# Patient Record
Sex: Female | Born: 1965 | Race: White | Hispanic: No | Marital: Married | Smoking: Never smoker
Health system: Southern US, Community
[De-identification: ages and names within clinical notes are randomized; demographics above are authoritative.]

## PROBLEM LIST (undated history)

## (undated) DIAGNOSIS — F329 Major depressive disorder, single episode, unspecified: Secondary | ICD-10-CM

## (undated) DIAGNOSIS — K219 Gastro-esophageal reflux disease without esophagitis: Secondary | ICD-10-CM

## (undated) DIAGNOSIS — Z8489 Family history of other specified conditions: Secondary | ICD-10-CM

## (undated) DIAGNOSIS — T148XXA Other injury of unspecified body region, initial encounter: Secondary | ICD-10-CM

## (undated) DIAGNOSIS — M1990 Unspecified osteoarthritis, unspecified site: Secondary | ICD-10-CM

## (undated) DIAGNOSIS — G2581 Restless legs syndrome: Secondary | ICD-10-CM

## (undated) DIAGNOSIS — F419 Anxiety disorder, unspecified: Secondary | ICD-10-CM

## (undated) DIAGNOSIS — F32A Depression, unspecified: Secondary | ICD-10-CM

## (undated) DIAGNOSIS — E119 Type 2 diabetes mellitus without complications: Secondary | ICD-10-CM

## (undated) DIAGNOSIS — I10 Essential (primary) hypertension: Secondary | ICD-10-CM

## (undated) HISTORY — PX: TONSILLECTOMY: SUR1361

## (undated) HISTORY — PX: HERNIA REPAIR: SHX51

## (undated) HISTORY — PX: JOINT REPLACEMENT: SHX530

---

## 2006-01-17 ENCOUNTER — Encounter: Payer: Self-pay | Admitting: Specialist

## 2006-01-30 ENCOUNTER — Encounter: Payer: Self-pay | Admitting: Specialist

## 2006-11-13 ENCOUNTER — Ambulatory Visit: Payer: Self-pay

## 2007-07-27 ENCOUNTER — Emergency Department: Payer: Self-pay | Admitting: Emergency Medicine

## 2007-07-28 ENCOUNTER — Ambulatory Visit: Payer: Self-pay | Admitting: Emergency Medicine

## 2007-12-14 IMAGING — CT CT STONE STUDY
1 of 2 series · 15 of 32 positions shown, 19 images · non-contrast
Comparison: none

REASON FOR EXAM: abdominal pain
COMMENTS:

[Series 2: stone · axial · 0.75mm/px · z∈[-595,-154]mm · 15 of 161 slices shown, 19 images]
[im 7/161  soft-tissue]
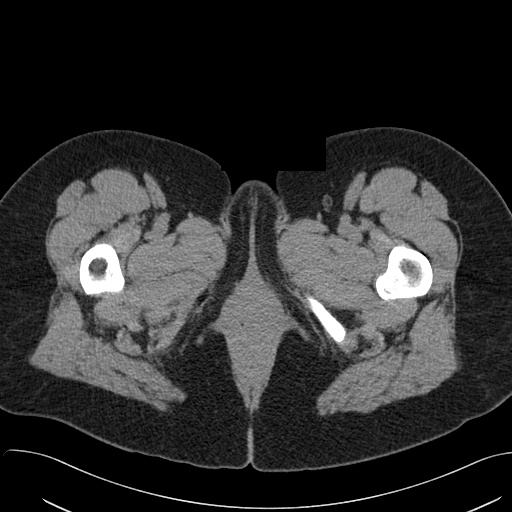
[im 7/161  bone]
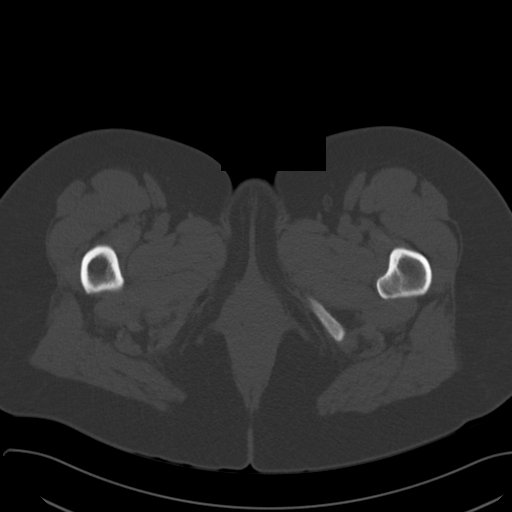
[im 21/161  soft-tissue]
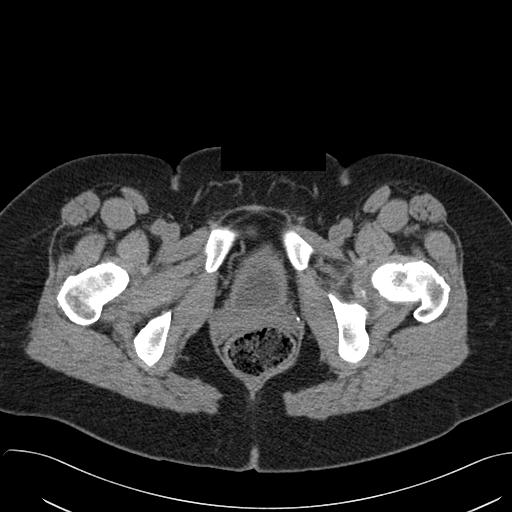
[im 34/161  soft-tissue]
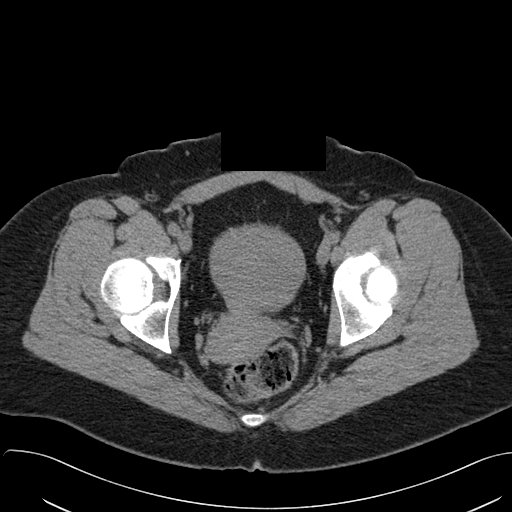
[im 47/161  soft-tissue]
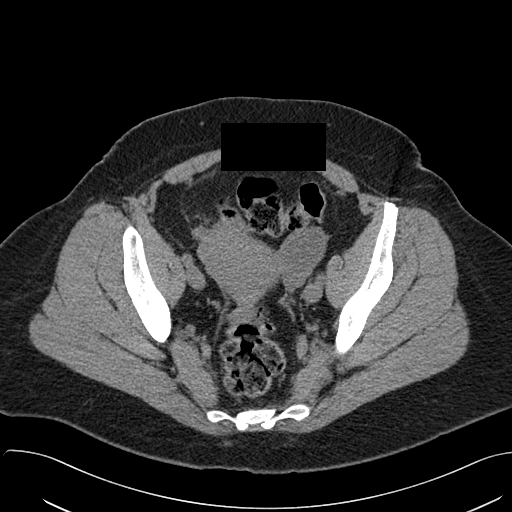
[im 54/161  soft-tissue]
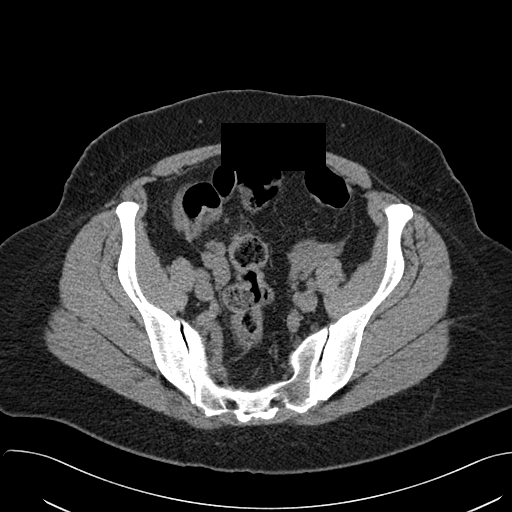
[im 67/161  soft-tissue]
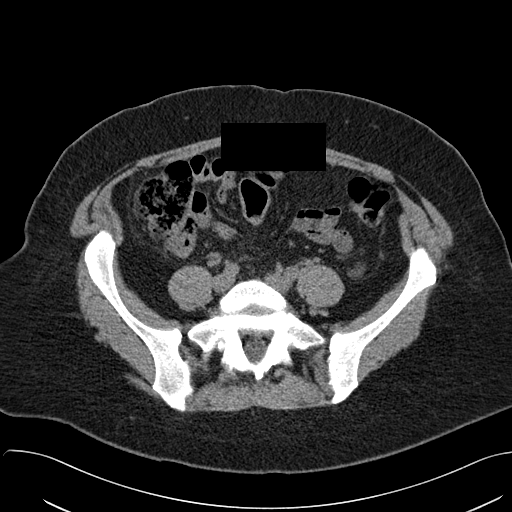
[im 81/161  soft-tissue]
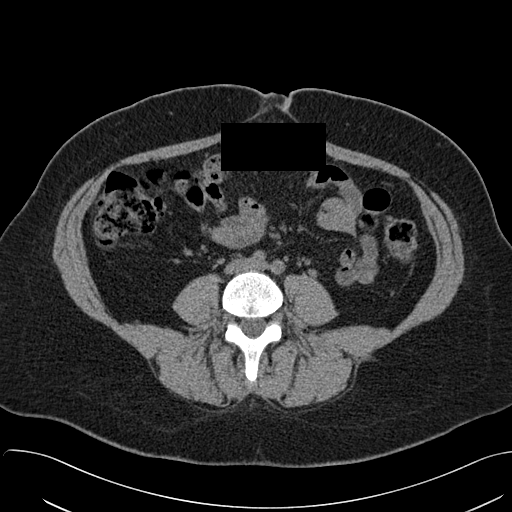
[im 94/161  soft-tissue]
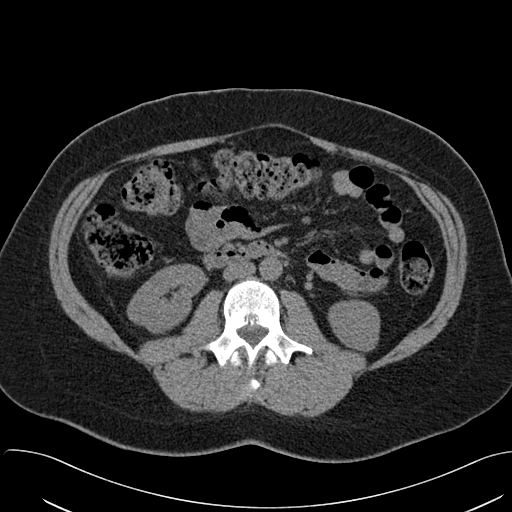
[im 107/161  soft-tissue]
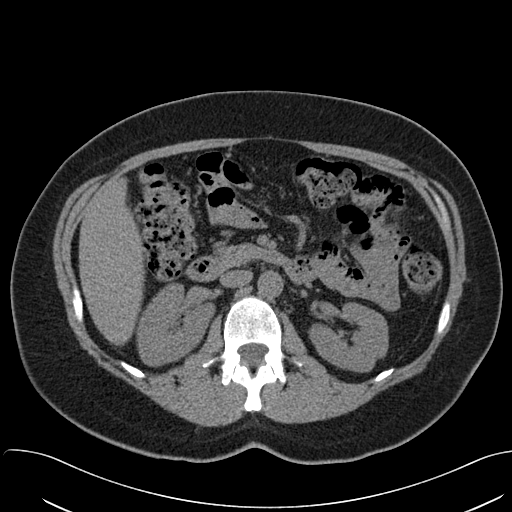
[im 107/161  bone]
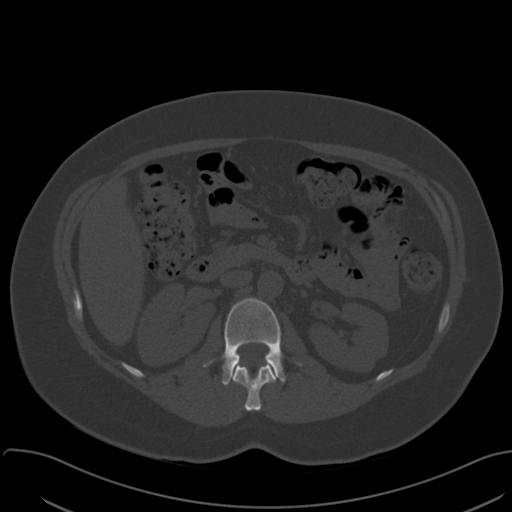
[im 114/161  soft-tissue]
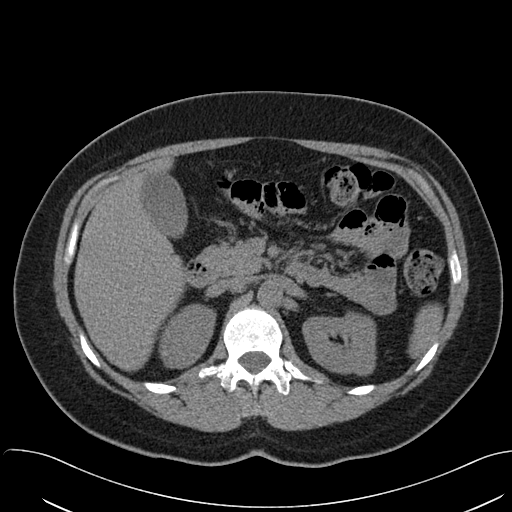
[im 127/161  soft-tissue]
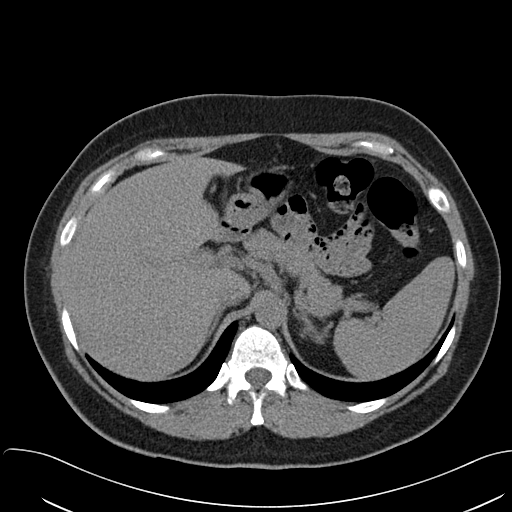
[im 134/161  lung]
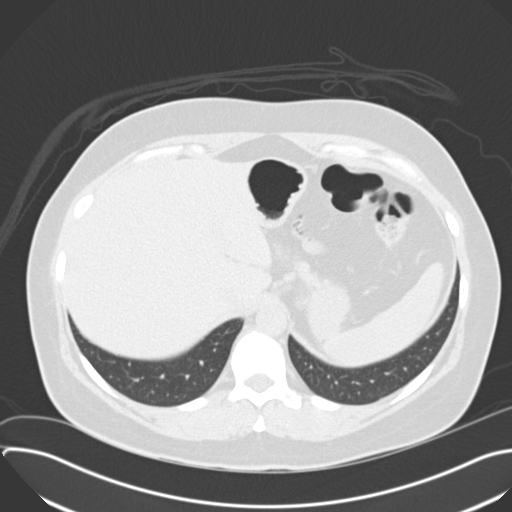
[im 141/161  soft-tissue]
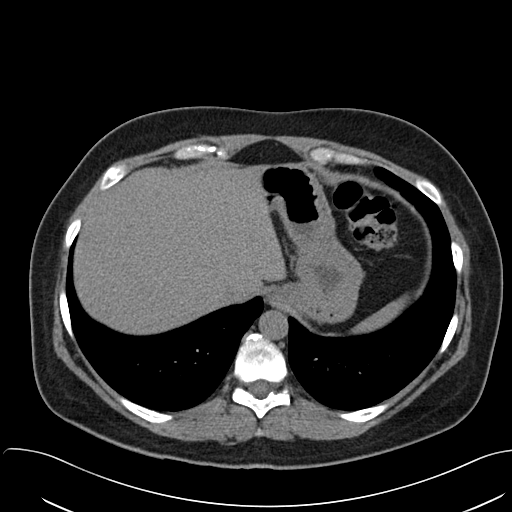
[im 141/161  lung]
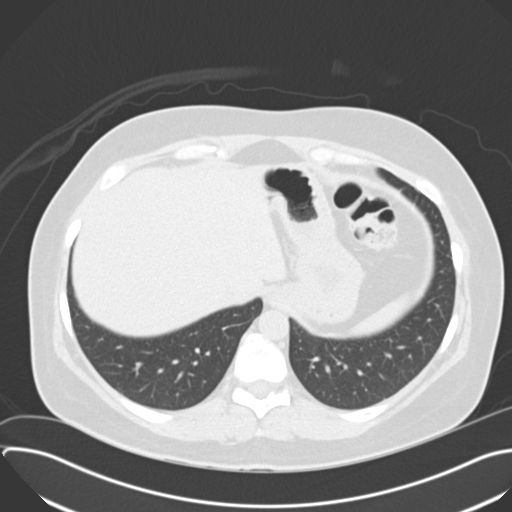
[im 147/161  lung]
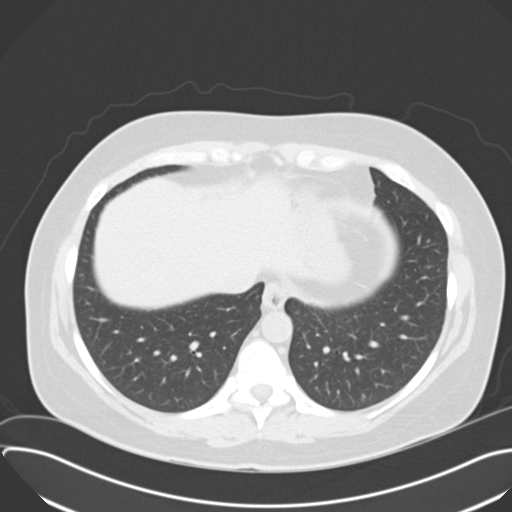
[im 154/161  soft-tissue]
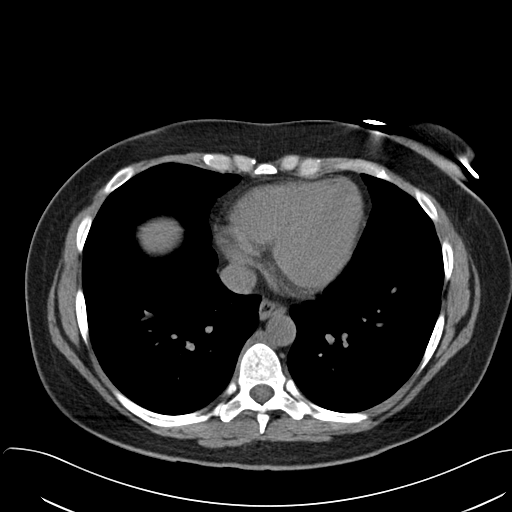
[im 154/161  lung]
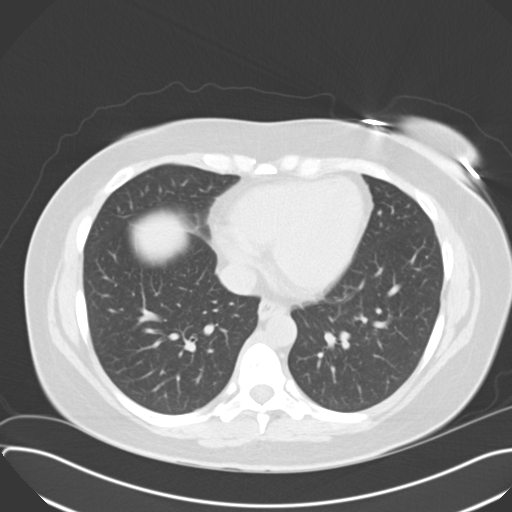

[15 of 32 positions shown; findings below may reference images not displayed]

PROCEDURE:     CT  - CT ABDOMEN /PELVIS WO (STONE)  - July 27, 2007  [DATE]

RESULT:     Helical non-contrast 3 mm sections were obtained from the lung
bases through the pubic symphysis.

Evaluation of the lung bases demonstrates no gross abnormalities.

Within the limitations of a non-contrast CT, the liver, spleen, adrenals and
pancreas are unremarkable. Evaluation of the RIGHT and LEFT kidneys
demonstrates no evidence of hydronephrosis, masses or calculi. There is no
evidence of hydroureter or ureterolithiasis. Phleboliths are identified
within the pelvis. The urinary bladder is partially contracted. A low
attenuating area is appreciated along the anterior portion of the LEFT ovary
possibly representing an ovarian cyst. This area roughly measures 3.3 x
cm in AP by transverse dimensions. There is no CT evidence reflecting the
sequela of appendicitis, diverticulitis, bowel obstruction or enteritis. A
moderate to large amount of stool is appreciated within the colon.
IMPRESSION: 1)No CT evidence of renal calculus disease.

2)Findings which may represent the sequela of a LEFT ovarian cyst.  Further
evaluation with pelvic ultrasound can be obtained if and as clinically
warranted.

Dr. Takisha of the Emergency Room was informed of these findings at the time
of the initial interpretation.

## 2008-01-12 ENCOUNTER — Ambulatory Visit: Payer: Self-pay

## 2009-01-31 ENCOUNTER — Ambulatory Visit: Payer: Self-pay

## 2009-12-15 ENCOUNTER — Ambulatory Visit: Payer: Self-pay | Admitting: General Surgery

## 2009-12-25 ENCOUNTER — Ambulatory Visit: Payer: Self-pay | Admitting: General Surgery

## 2010-05-17 ENCOUNTER — Ambulatory Visit: Payer: Self-pay | Admitting: Family Medicine

## 2010-05-30 ENCOUNTER — Ambulatory Visit: Payer: Self-pay | Admitting: General Practice

## 2010-12-22 ENCOUNTER — Emergency Department: Payer: Self-pay | Admitting: Emergency Medicine

## 2011-01-23 ENCOUNTER — Encounter: Payer: Self-pay | Admitting: Specialist

## 2011-01-31 ENCOUNTER — Encounter: Payer: Self-pay | Admitting: Specialist

## 2011-03-03 ENCOUNTER — Encounter: Payer: Self-pay | Admitting: Specialist

## 2017-09-01 ENCOUNTER — Encounter
Admission: RE | Admit: 2017-09-01 | Discharge: 2017-09-01 | Disposition: A | Discharge status: Home / Self Care | Payer: BLUE CROSS/BLUE SHIELD | Source: Ambulatory Visit | Attending: Surgery | Admitting: Surgery

## 2017-09-01 DIAGNOSIS — R9431 Abnormal electrocardiogram [ECG] [EKG]: Secondary | ICD-10-CM | POA: Diagnosis not present

## 2017-09-01 DIAGNOSIS — D171 Benign lipomatous neoplasm of skin and subcutaneous tissue of trunk: Secondary | ICD-10-CM | POA: Diagnosis not present

## 2017-09-01 DIAGNOSIS — Z01818 Encounter for other preprocedural examination: Secondary | ICD-10-CM | POA: Diagnosis not present

## 2017-09-01 HISTORY — DX: Family history of other specified conditions: Z84.89

## 2017-09-01 HISTORY — DX: Other injury of unspecified body region, initial encounter: T14.8XXA

## 2017-09-01 HISTORY — DX: Unspecified osteoarthritis, unspecified site: M19.90

## 2017-09-01 HISTORY — DX: Restless legs syndrome: G25.81

## 2017-09-01 HISTORY — DX: Gastro-esophageal reflux disease without esophagitis: K21.9

## 2017-09-01 HISTORY — DX: Anxiety disorder, unspecified: F41.9

## 2017-09-01 HISTORY — DX: Essential (primary) hypertension: I10

## 2017-09-01 HISTORY — DX: Major depressive disorder, single episode, unspecified: F32.9

## 2017-09-01 HISTORY — DX: Depression, unspecified: F32.A

## 2017-09-01 HISTORY — DX: Type 2 diabetes mellitus without complications: E11.9

## 2017-09-01 LAB — DIFFERENTIAL
BASOS PCT: 1 %
Basophils Absolute: 0 10*3/uL (ref 0–0.1)
EOS ABS: 0.1 10*3/uL (ref 0–0.7)
Eosinophils Relative: 2 %
LYMPHS ABS: 2.1 10*3/uL (ref 1.0–3.6)
Lymphocytes Relative: 34 %
MONO ABS: 0.6 10*3/uL (ref 0.2–0.9)
MONOS PCT: 10 %
Neutro Abs: 3.3 10*3/uL (ref 1.4–6.5)
Neutrophils Relative %: 53 %

## 2017-09-01 LAB — CBC
HEMATOCRIT: 38.9 % (ref 35.0–47.0)
HEMOGLOBIN: 13.9 g/dL (ref 12.0–16.0)
MCH: 32.1 pg (ref 26.0–34.0)
MCHC: 35.6 g/dL (ref 32.0–36.0)
MCV: 90 fL (ref 80.0–100.0)
Platelets: 271 10*3/uL (ref 150–440)
RBC: 4.32 MIL/uL (ref 3.80–5.20)
RDW: 13.2 % (ref 11.5–14.5)
WBC: 6.1 10*3/uL (ref 3.6–11.0)

## 2017-09-01 LAB — BASIC METABOLIC PANEL
ANION GAP: 9 (ref 5–15)
BUN: 21 mg/dL — AB (ref 6–20)
CO2: 25 mmol/L (ref 22–32)
Calcium: 9.3 mg/dL (ref 8.9–10.3)
Chloride: 107 mmol/L (ref 101–111)
Creatinine, Ser: 0.97 mg/dL (ref 0.44–1.00)
GFR calc Af Amer: >60 mL/min (ref >60)
GLUCOSE: 109 mg/dL — AB (ref 65–99)
POTASSIUM: 3.5 mmol/L (ref 3.5–5.1)
Sodium: 141 mmol/L (ref 135–145)

## 2017-09-01 NOTE — Pre-Procedure Instructions (Signed)
EKG ADDRESSED BY DR J ADAMS AND FAXED TO DR Nicholes Stairs TO HAVE SET UP WITH PCP OR CARDIOLOGIST. SPOKE WITH Ra Pfiester.

## 2017-09-01 NOTE — Patient Instructions (Signed)
Your procedure is scheduled on: Tuesday 09/09/17 Report to [facility]. 2ND FLOOR MEDICAL MALL ENTRANCE. To find out your arrival time please call [phone] between 1PM - 3PM on Monday 09/08/17.  Remember: Instructions that are not followed completely may result in serious medical risk, up to and including death, or upon the discretion of your surgeon and anesthesiologist your surgery may need to be rescheduled.    __X__ 1. Do not eat anything after midnight the night before your    procedure.  No gum chewing or hard candies.  You may drink clear   liquids up to 2 hours before you are scheduled to arrive at the   hospital for your procedure. Do not drink clear liquids within 2   hours of scheduled arrival to the hospital as this may lead to your   procedure being delayed or rescheduled.       Clear liquids include:   Water or Apple juice without pulp   Clear carbohydrate beverage such as Clearfast or Gatorade   Black coffee or Clear Tea (no milk, no creamer, do not add anything   to the coffee or tea)    Diabetics should only drink water   __X__ 2. No Alcohol for 24 hours before or after surgery.   ____ 3. Bring all medications with you on the day of surgery if instructed.    __X__ 4. Notify your doctor if there is any change in your medical condition     (cold, fever, infections).             __X___5. No smoking within 24 hours of your surgery.     Do not wear jewelry, make-up, hairpins, clips or nail polish.  Do not wear lotions, powders, or perfumes.   Do not shave 48 hours prior to surgery. Men may shave face and neck.  Do not bring valuables to the hospital.    [facility] is not responsible for any belongings or valuables.               Contacts, dentures or bridgework may not be worn into surgery.  Leave your suitcase in the car. After surgery it may be brought to your room.  For patients admitted to the hospital, discharge time is determined by your                treatment  team.   Patients discharged the day of surgery will not be allowed to drive home.   Please read over the following fact sheets that you were given:   MRSA Information   _X__ Take these medicines the morning of surgery with A SIP OF WATER:    1. none  2.   3.   4.  5.  6.  ____ Fleet Enema (as directed)   __X__ Use CHG Soap/SAGE wipes as directed  ____ Use inhalers on the day of surgery  ____ Stop metformin 2 days prior to surgery    ____ Take 1/2 of usual insulin dose the night before surgery and none on the morning of surgery.   __X__ Stop Coumadin/Plavix/aspirin on 09/02/17  __X__ Stop Anti-inflammatories such as Advil, Aleve, Ibuprofen, Motrin, Naproxen, Naprosyn, Goodies,powder, or aspirin products.  OK to take Tylenol.  09/06/17   __X__ Stop supplements, Vitamin E, Fish Oil until after surgery.    ____ Bring C-Pap to the hospital.

## 2017-09-09 ENCOUNTER — Ambulatory Visit
Admission: RE | Admit: 2017-09-09 | Discharge: 2017-09-09 | Disposition: A | Discharge status: Home / Self Care | Payer: BLUE CROSS/BLUE SHIELD | Source: Ambulatory Visit | Attending: Surgery | Admitting: Surgery

## 2017-09-09 ENCOUNTER — Encounter: Payer: Self-pay | Admitting: Emergency Medicine

## 2017-09-09 ENCOUNTER — Ambulatory Visit: Payer: BLUE CROSS/BLUE SHIELD | Admitting: Anesthesiology

## 2017-09-09 ENCOUNTER — Encounter
Admission: RE | Disposition: A | Discharge status: Home / Self Care | Payer: Self-pay | Source: Ambulatory Visit | Attending: Surgery

## 2017-09-09 DIAGNOSIS — Z79899 Other long term (current) drug therapy: Secondary | ICD-10-CM | POA: Diagnosis not present

## 2017-09-09 DIAGNOSIS — Z791 Long term (current) use of non-steroidal anti-inflammatories (NSAID): Secondary | ICD-10-CM | POA: Insufficient documentation

## 2017-09-09 DIAGNOSIS — I10 Essential (primary) hypertension: Secondary | ICD-10-CM | POA: Insufficient documentation

## 2017-09-09 DIAGNOSIS — Z7982 Long term (current) use of aspirin: Secondary | ICD-10-CM | POA: Diagnosis not present

## 2017-09-09 DIAGNOSIS — Z7951 Long term (current) use of inhaled steroids: Secondary | ICD-10-CM | POA: Diagnosis not present

## 2017-09-09 DIAGNOSIS — F419 Anxiety disorder, unspecified: Secondary | ICD-10-CM | POA: Insufficient documentation

## 2017-09-09 DIAGNOSIS — Z96641 Presence of right artificial hip joint: Secondary | ICD-10-CM | POA: Diagnosis not present

## 2017-09-09 DIAGNOSIS — E119 Type 2 diabetes mellitus without complications: Secondary | ICD-10-CM | POA: Diagnosis not present

## 2017-09-09 DIAGNOSIS — D171 Benign lipomatous neoplasm of skin and subcutaneous tissue of trunk: Secondary | ICD-10-CM | POA: Diagnosis not present

## 2017-09-09 DIAGNOSIS — F329 Major depressive disorder, single episode, unspecified: Secondary | ICD-10-CM | POA: Insufficient documentation

## 2017-09-09 HISTORY — PX: EXCISION OF BACK LESION: SHX6597

## 2017-09-09 LAB — GLUCOSE, CAPILLARY: Glucose-Capillary: 114 mg/dL — ABNORMAL HIGH (ref 65–99)

## 2017-09-09 SURGERY — EXCISION, LESION, BACK
Anesthesia: General | Site: Back | Wound class: Clean

## 2017-09-09 MED ORDER — MIDAZOLAM HCL 2 MG/2ML IJ SOLN
INTRAMUSCULAR | Status: AC
  Filled 2017-09-09: qty 2

## 2017-09-09 MED ORDER — PROPOFOL 10 MG/ML IV BOLUS
INTRAVENOUS | Status: DC | PRN
Start: 2017-09-09 — End: 2017-09-09
  Administered 2017-09-09: 130 mg via INTRAVENOUS

## 2017-09-09 MED ORDER — LIDOCAINE HCL (PF) 2 % IJ SOLN
INTRAMUSCULAR | Status: AC
  Filled 2017-09-09: qty 2

## 2017-09-09 MED ORDER — BUPIVACAINE HCL (PF) 0.5 % IJ SOLN
INTRAMUSCULAR | Status: AC
  Filled 2017-09-09: qty 30

## 2017-09-09 MED ORDER — SUCCINYLCHOLINE CHLORIDE 20 MG/ML IJ SOLN
INTRAMUSCULAR | Status: DC | PRN
  Administered 2017-09-09: 100 mg via INTRAVENOUS

## 2017-09-09 MED ORDER — FAMOTIDINE 20 MG PO TABS
20.0000 mg | ORAL_TABLET | Freq: Once | ORAL | Status: AC
  Administered 2017-09-09: 20 mg via ORAL

## 2017-09-09 MED ORDER — ONDANSETRON HCL 4 MG/2ML IJ SOLN
INTRAMUSCULAR | Status: AC
  Filled 2017-09-09: qty 2

## 2017-09-09 MED ORDER — DEXAMETHASONE SODIUM PHOSPHATE 10 MG/ML IJ SOLN
INTRAMUSCULAR | Status: DC | PRN
  Administered 2017-09-09: 10 mg via INTRAVENOUS

## 2017-09-09 MED ORDER — LIDOCAINE HCL (PF) 1 % IJ SOLN
INTRAMUSCULAR | Status: AC
  Filled 2017-09-09: qty 30

## 2017-09-09 MED ORDER — BUPIVACAINE-EPINEPHRINE (PF) 0.5% -1:200000 IJ SOLN
INTRAMUSCULAR | Status: AC
  Filled 2017-09-09: qty 30

## 2017-09-09 MED ORDER — ONDANSETRON HCL 4 MG/2ML IJ SOLN
INTRAMUSCULAR | Status: DC | PRN
  Administered 2017-09-09: 4 mg via INTRAVENOUS

## 2017-09-09 MED ORDER — FAMOTIDINE 20 MG PO TABS
ORAL_TABLET | ORAL | Status: AC
  Filled 2017-09-09: qty 1

## 2017-09-09 MED ORDER — TRAMADOL HCL 50 MG PO TABS: 50.0000 mg | ORAL_TABLET | ORAL | 0 refills | Status: AC | PRN

## 2017-09-09 MED ORDER — PROPOFOL 500 MG/50ML IV EMUL
INTRAVENOUS | Status: AC
  Filled 2017-09-09: qty 50

## 2017-09-09 MED ORDER — TRAMADOL HCL 50 MG PO TABS: 50.0000 mg | ORAL_TABLET | ORAL | Status: DC | PRN

## 2017-09-09 MED ORDER — FENTANYL CITRATE (PF) 100 MCG/2ML IJ SOLN
INTRAMUSCULAR | Status: AC
  Filled 2017-09-09: qty 2

## 2017-09-09 MED ORDER — BUPIVACAINE-EPINEPHRINE 0.5% -1:200000 IJ SOLN
INTRAMUSCULAR | Status: DC | PRN
  Administered 2017-09-09: 10 mL

## 2017-09-09 MED ORDER — EPHEDRINE SULFATE 50 MG/ML IJ SOLN
INTRAMUSCULAR | Status: DC | PRN
  Administered 2017-09-09: 20 mg via INTRAVENOUS

## 2017-09-09 MED ORDER — DEXAMETHASONE SODIUM PHOSPHATE 10 MG/ML IJ SOLN
INTRAMUSCULAR | Status: AC
  Filled 2017-09-09: qty 1

## 2017-09-09 MED ORDER — HYDROCODONE-ACETAMINOPHEN 5-325 MG PO TABS: 1.0000 | ORAL_TABLET | Freq: Four times a day (QID) | ORAL | 0 refills | Status: DC | PRN

## 2017-09-09 MED ORDER — LIDOCAINE HCL (CARDIAC) 20 MG/ML IV SOLN
INTRAVENOUS | Status: DC | PRN
  Administered 2017-09-09: 40 mg via INTRAVENOUS

## 2017-09-09 MED ORDER — FENTANYL CITRATE (PF) 100 MCG/2ML IJ SOLN
INTRAMUSCULAR | Status: DC | PRN
  Administered 2017-09-09 (×3): 50 ug via INTRAVENOUS

## 2017-09-09 MED ORDER — HYDROCODONE-ACETAMINOPHEN 5-325 MG PO TABS: 1.0000 | ORAL_TABLET | ORAL | Status: DC | PRN

## 2017-09-09 MED ORDER — SODIUM CHLORIDE 0.9 % IV SOLN
INTRAVENOUS | Status: DC
  Administered 2017-09-09: 11:00:00 via INTRAVENOUS

## 2017-09-09 MED ORDER — MIDAZOLAM HCL 2 MG/2ML IJ SOLN
INTRAMUSCULAR | Status: DC | PRN
  Administered 2017-09-09: 2 mg via INTRAVENOUS

## 2017-09-09 SURGICAL SUPPLY — 27 items
BLADE SURG 15 STRL LF DISP TIS (BLADE) ×1 IMPLANT
BLADE SURG 15 STRL SS (BLADE) ×2
CHLORAPREP W/TINT 26ML (MISCELLANEOUS) ×3 IMPLANT
DERMABOND ADVANCED (GAUZE/BANDAGES/DRESSINGS) ×2
DERMABOND ADVANCED .7 DNX12 (GAUZE/BANDAGES/DRESSINGS) ×1 IMPLANT
DRAPE LAPAROTOMY 100X77 ABD (DRAPES) ×3 IMPLANT
ELECT REM PT RETURN 9FT ADLT (ELECTROSURGICAL) ×3
ELECTRODE REM PT RTRN 9FT ADLT (ELECTROSURGICAL) ×1 IMPLANT
GLOVE BIO SURGEON STRL SZ7.5 (GLOVE) ×3 IMPLANT
GOWN STRL REUS W/ TWL LRG LVL3 (GOWN DISPOSABLE) ×2 IMPLANT
GOWN STRL REUS W/TWL LRG LVL3 (GOWN DISPOSABLE) ×4
KIT RM TURNOVER STRD PROC AR (KITS) ×3 IMPLANT
LABEL OR SOLS (LABEL) ×3 IMPLANT
NEEDLE HYPO 25X1 1.5 SAFETY (NEEDLE) ×3 IMPLANT
NS IRRIG 500ML POUR BTL (IV SOLUTION) ×3 IMPLANT
PACK BASIN MINOR ARMC (MISCELLANEOUS) ×3 IMPLANT
SUT MNCRL 3-0 UNDYED SH (SUTURE) ×1 IMPLANT
SUT MNCRL 4-0 (SUTURE) ×2
SUT MNCRL 4-0 27XMFL (SUTURE) ×1
SUT MONOCRYL 3-0 UNDYED (SUTURE) ×2
SUT VIC AB 3-0 SH 27 (SUTURE) ×2
SUT VIC AB 3-0 SH 27X BRD (SUTURE) ×1 IMPLANT
SUT VIC AB 4-0 SH 27 (SUTURE) ×2
SUT VIC AB 4-0 SH 27XANBCTRL (SUTURE) ×1 IMPLANT
SUTURE MNCRL 4-0 27XMF (SUTURE) ×1 IMPLANT
SYR BULB EAR ULCER 3OZ GRN STR (SYRINGE) ×3 IMPLANT
SYRINGE 10CC LL (SYRINGE) ×3 IMPLANT

## 2017-09-09 NOTE — Transfer of Care (Signed)
Immediate Anesthesia Transfer of Care Note  Patient: Catherine Shaw  Procedure(s) Performed: EXCISION OF LIPOMA ON BACK (N/A Back)  Patient Location: PACU  Anesthesia Type:General  Level of Consciousness: sedated  Airway & Oxygen Therapy: Patient Spontanous Breathing and Patient connected to face mask oxygen  Post-op Assessment: Report given to RN and Post -op Vital signs reviewed and stable  Post vital signs: Reviewed and stable  Last Vitals:  Vitals:   09/09/17 1017  BP: 106/70  Pulse: 78  Resp: 18  Temp: 36.9 C  SpO2: 98%    Last Pain:  Vitals:   09/09/17 1017  TempSrc: Oral         Complications: No apparent anesthesia complications

## 2017-09-09 NOTE — H&P (Signed)
  She comes in today for excision of a subcutaneous lipoma of the mid aspect of the back.  She reports no change in overall condition since the recent office exam.  I discussed the plan for surgery and anesthesia

## 2017-09-09 NOTE — Discharge Instructions (Addendum)
AMBULATORY SURGERY  DISCHARGE INSTRUCTIONS   1) The drugs that you were given will stay in your system until tomorrow so for the next 24 hours you should not:  A) Drive an automobile B) Make any legal decisions C) Drink any alcoholic beverage   2) You may resume regular meals tomorrow.  Today it is better to start with liquids and gradually work up to solid foods.  You may eat anything you prefer, but it is better to start with liquids, then soup and crackers, and gradually work up to solid foods.   3) Please notify your doctor immediately if you have any unusual bleeding, trouble breathing, redness and pain at the surgery site, drainage, fever, or pain not relieved by medication.    4) Additional Instructions:        Please contact your physician with any problems or Same Day Surgery at [phone], Monday through Friday 6 am to 4 pm, or  at [facility] number at [phone].Take Tylenol and/or tramadol as needed for pain.  Should not drive or do anything dangerous when taking tramadol.  May shower and blot dry.  Gradually resume usual activities as tolerated.

## 2017-09-09 NOTE — Anesthesia Procedure Notes (Signed)
Procedure Name: Intubation Date/Time: 09/09/2017 11:07 AM Performed by: Jonna Clark Pre-anesthesia Checklist: Patient identified, Patient being monitored, Timeout performed, Emergency Drugs available and Suction available Patient Re-evaluated:Patient Re-evaluated prior to induction Oxygen Delivery Method: Circle system utilized Preoxygenation: Pre-oxygenation with 100% oxygen Induction Type: IV induction Ventilation: Mask ventilation without difficulty Laryngoscope Size: Miller and 2 Grade View: Grade I Tube type: Oral Tube size: 7.0 mm Number of attempts: 1 Airway Equipment and Method: Stylet Placement Confirmation: ETT inserted through vocal cords under direct vision,  positive ETCO2 and breath sounds checked- equal and bilateral Secured at: 21 cm Tube secured with: Tape Dental Injury: Teeth and Oropharynx as per pre-operative assessment

## 2017-09-09 NOTE — Op Note (Deleted)
OPERATIVE REPORT  PREOPERATIVE DIAGNOSIS:  Chronic cholecystitis cholelithiasis  POSTOPERATIVE DIAGNOSIS: Chronic cholecystitis cholelithiasis  PROCEDURE: Laparoscopy  ANESTHESIA: General  SURGEON: Rochel Brome M.D.  INDICATIONS: She had a history of epigastric pain in January another episode in February and another episode in June. She had ultrasound findings of gallstones and thickened gallbladder wall. During surgery there was a finding of dense adhesions surrounding the gallbladder and decision was made to close without removing the gallbladder.    With the patient on the operating table in the supine position under general endotracheal anesthesia the abdomen was prepared with ChloraPrep solution and draped in a sterile manner. A short incision was made in the inferior aspect of the umbilicus and carried down to the deep fascia which was grasped with a laryngeal hook. A Veress needle was inserted aspirated and irrigated with a saline solution. The peritoneal cavity was insufflated with carbon dioxide. The Veress needle was removed. The 10 mm cannula was inserted. The 10 mm 0 laparoscope was inserted to view the peritoneal cavity.  Another incision was made in the epigastrium slightly to the right of the midline to introduce an 11 mm cannula. 2 incisions were made in the lateral aspect of the right upper quadrant to introduce 2   5 mm cannulas. Initial inspection revealed a smooth surface of the liver and omentum and several loops of small bowel and sigmoid colon which appeared typical. Dissection was begun in the right upper quadrant finding the omentum adherent to the liver. Dissection was done with microdissector and also and cautery and also use of suction. There were multiple adhesions between the omentum and the liver. As these adhesions were dissected there were multiple places that bled. Multiple bleeding points were cauterized and also pressure held and site was aspirated. After a period  of time of dissection and appeared that little progress was being made towards finding the gallbladder. There was an unusually large amount of dense adhesions which were quite friable. The decision was made on her presenting history and current findings to abort the operation and removed cannulas without removing the gallbladder. The other option of proceeding to an open operation was considered. Decision was made to close and avoid potential complications of surgery. The skin incisions were closed with interrupted 5-0 chromic subcutaneous suture benzoin and Steri-Strips. Gauze dressings were applied with paper tape.  The patient appeared to be in satisfactory condition and was prepared for transfer to the recovery room  [place].D.

## 2017-09-09 NOTE — OR Nursing (Signed)
Dr. Smith in to see patient

## 2017-09-09 NOTE — Op Note (Signed)
OPERATIVE REPORT  PREOPERATIVE  DIAGNOSIS: . Lipoma of back  POSTOPERATIVE DIAGNOSIS: . Lipoma of back  PROCEDURE: . Excision lipoma of back  ANESTHESIA:  General  SURGEON: Rochel Brome  MD   INDICATIONS: . She has a long history of development of a mass of the mid aspect of the back. She has had some recent development of mild pain at the site. The mass is demonstrated on exam approximately 8 cm in dimension and excision was recommended for definitive treatment.  With the patient on the stretcher in the supine position she was placed under general endotracheal anesthesia. She was then rolled over onto the operating table in the prone position. The upper central aspect of the back was prepared with ChloraPrep and draped in a sterile manner the skin overlying the mass was infiltrated with 1/2% Sensorcaine with epinephrine. A transversely oriented elliptical incision was made in the skin some 8 cm in length. The ellipse excised was approximately 1 cm in width. Dissection was carried down through the full thickness of the skin into the subcutaneous fatty tissue. Dissection was carried further incising fascia and found a smooth plane of dissection finding a lipoma which was moderately lobulated. It was peeled off the underlying trapezius muscle. There were several prominent veins at the site which were clamped and then suture ligated with 3-0 Vicryl. The mass was completely excised and submitted with the overlying ellipse of skin in formalin for routine pathology. The wound was inspected and several small bleeding points cauterized. Hemostasis was subsequently intact. Deeper tissues were infiltrated with Sensorcaine with epinephrine. The fascia was closed with interrupted 3-0 Monocryl figure-of-eight sutures. The skin was then closed with a running 4-0 Monocryl subcuticular suture and Dermabond. Estimated blood loss approximately 20 cc.  The patient tolerated the procedure satisfactorily and was  prepared for transfer to the recovery room  [facility].D.

## 2017-09-09 NOTE — Anesthesia Post-op Follow-up Note (Signed)
Anesthesia QCDR form completed.        

## 2017-09-10 ENCOUNTER — Encounter: Payer: Self-pay | Admitting: Surgery

## 2017-09-10 LAB — SURGICAL PATHOLOGY

## 2017-09-10 NOTE — Anesthesia Postprocedure Evaluation (Signed)
Anesthesia Post Note  Patient: Catherine Shaw  Procedure(s) Performed: EXCISION OF LIPOMA ON BACK (N/A Back)  Patient location during evaluation: PACU Anesthesia Type: General Level of consciousness: awake and alert Pain management: pain level controlled Vital Signs Assessment: post-procedure vital signs reviewed and stable Respiratory status: spontaneous breathing, nonlabored ventilation, respiratory function stable and patient connected to nasal cannula oxygen Cardiovascular status: blood pressure returned to baseline and stable Postop Assessment: no apparent nausea or vomiting Anesthetic complications: no     Last Vitals:  Vitals:   09/09/17 1331 09/09/17 1356  BP: 120/83 124/78  Pulse: 77 78  Resp: 18 18  Temp: (!) 36.2 C   SpO2: 100% 99%    Last Pain:  Vitals:   09/10/17 0841  TempSrc:   PainSc: 1                  Molli Barrows

## 2017-09-10 NOTE — Anesthesia Preprocedure Evaluation (Signed)
Anesthesia Evaluation  Patient identified by MRN, date of birth, ID band Patient awake    Reviewed: Allergy & Precautions, H&P , NPO status , Patient's Chart, lab work & pertinent test results, reviewed documented beta blocker date and time   Airway Mallampati: II   Neck ROM: full    Dental  (+) Poor Dentition   Pulmonary neg pulmonary ROS,    Pulmonary exam normal        Cardiovascular hypertension, negative cardio ROS Normal cardiovascular exam Rhythm:regular Rate:Normal     Neuro/Psych negative neurological ROS  negative psych ROS   GI/Hepatic negative GI ROS, Neg liver ROS,   Endo/Other  negative endocrine ROSdiabetes  Renal/GU negative Renal ROS  negative genitourinary   Musculoskeletal   Abdominal   Peds  Hematology negative hematology ROS (+)   Anesthesia Other Findings Past Medical History: No date: Anxiety No date: Arthritis No date: Depression No date: Diabetes mellitus without complication (HCC) No date: Family history of adverse reaction to anesthesia     Comment:  PONV father No date: GERD (gastroesophageal reflux disease) No date: Hypertension 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 No date: RLS (restless legs syndrome) Past Surgical History: No date: HERNIA REPAIR No date: JOINT REPLACEMENT; Right     Comment:  hip No date: TONSILLECTOMY BMI    Body Mass Index:  31.47 kg/m     Reproductive/Obstetrics negative OB ROS                             Anesthesia Physical Anesthesia Plan  ASA: III  Anesthesia Plan: General   Post-op Pain Management:    Induction:   PONV Risk Score and Plan:   Airway Management Planned:   Additional Equipment:   Intra-op Plan:   Post-operative Plan:   Informed Consent: I have reviewed the patients History and Physical, chart, labs and discussed the procedure including the risks,  benefits and alternatives for the proposed anesthesia with the patient or authorized representative who has indicated his/her understanding and acceptance.   Dental Advisory Given  Plan Discussed with: CRNA  Anesthesia Plan Comments:         Anesthesia Quick Evaluation

## 2017-10-08 ENCOUNTER — Encounter: Payer: Self-pay | Admitting: Surgery
